# Patient Record
Sex: Female | Born: 1985 | Race: White | Hispanic: No | Marital: Single | Smoking: Current every day smoker
Health system: Southern US, Community
[De-identification: ages and names within clinical notes are randomized; demographics above are authoritative.]

## PROBLEM LIST (undated history)

## (undated) DIAGNOSIS — C801 Malignant (primary) neoplasm, unspecified: Secondary | ICD-10-CM

## (undated) DIAGNOSIS — F419 Anxiety disorder, unspecified: Secondary | ICD-10-CM

## (undated) HISTORY — PX: CERVICAL CONE BIOPSY: SUR198

---

## 2007-06-03 ENCOUNTER — Emergency Department (HOSPITAL_COMMUNITY): Admission: EM | Admit: 2007-06-03 | Discharge: 2007-06-03 | Payer: Self-pay | Admitting: Emergency Medicine

## 2009-08-18 IMAGING — CR DG CHEST 2V
2 series · 2 of 2 positions shown · non-contrast
Comparison: none

HISTORY: Cough, nausea, vomiting, diarrhea, fever

CHEST 2 VIEWS:
No prior study for comparison.
Normal heart size, mediastinal contours, and pulmonary vascularity.
Mild bronchitic changes.
No infiltrate, effusion, or pneumothorax.
Bones unremarkable.

[view not recorded (1 of 2)]
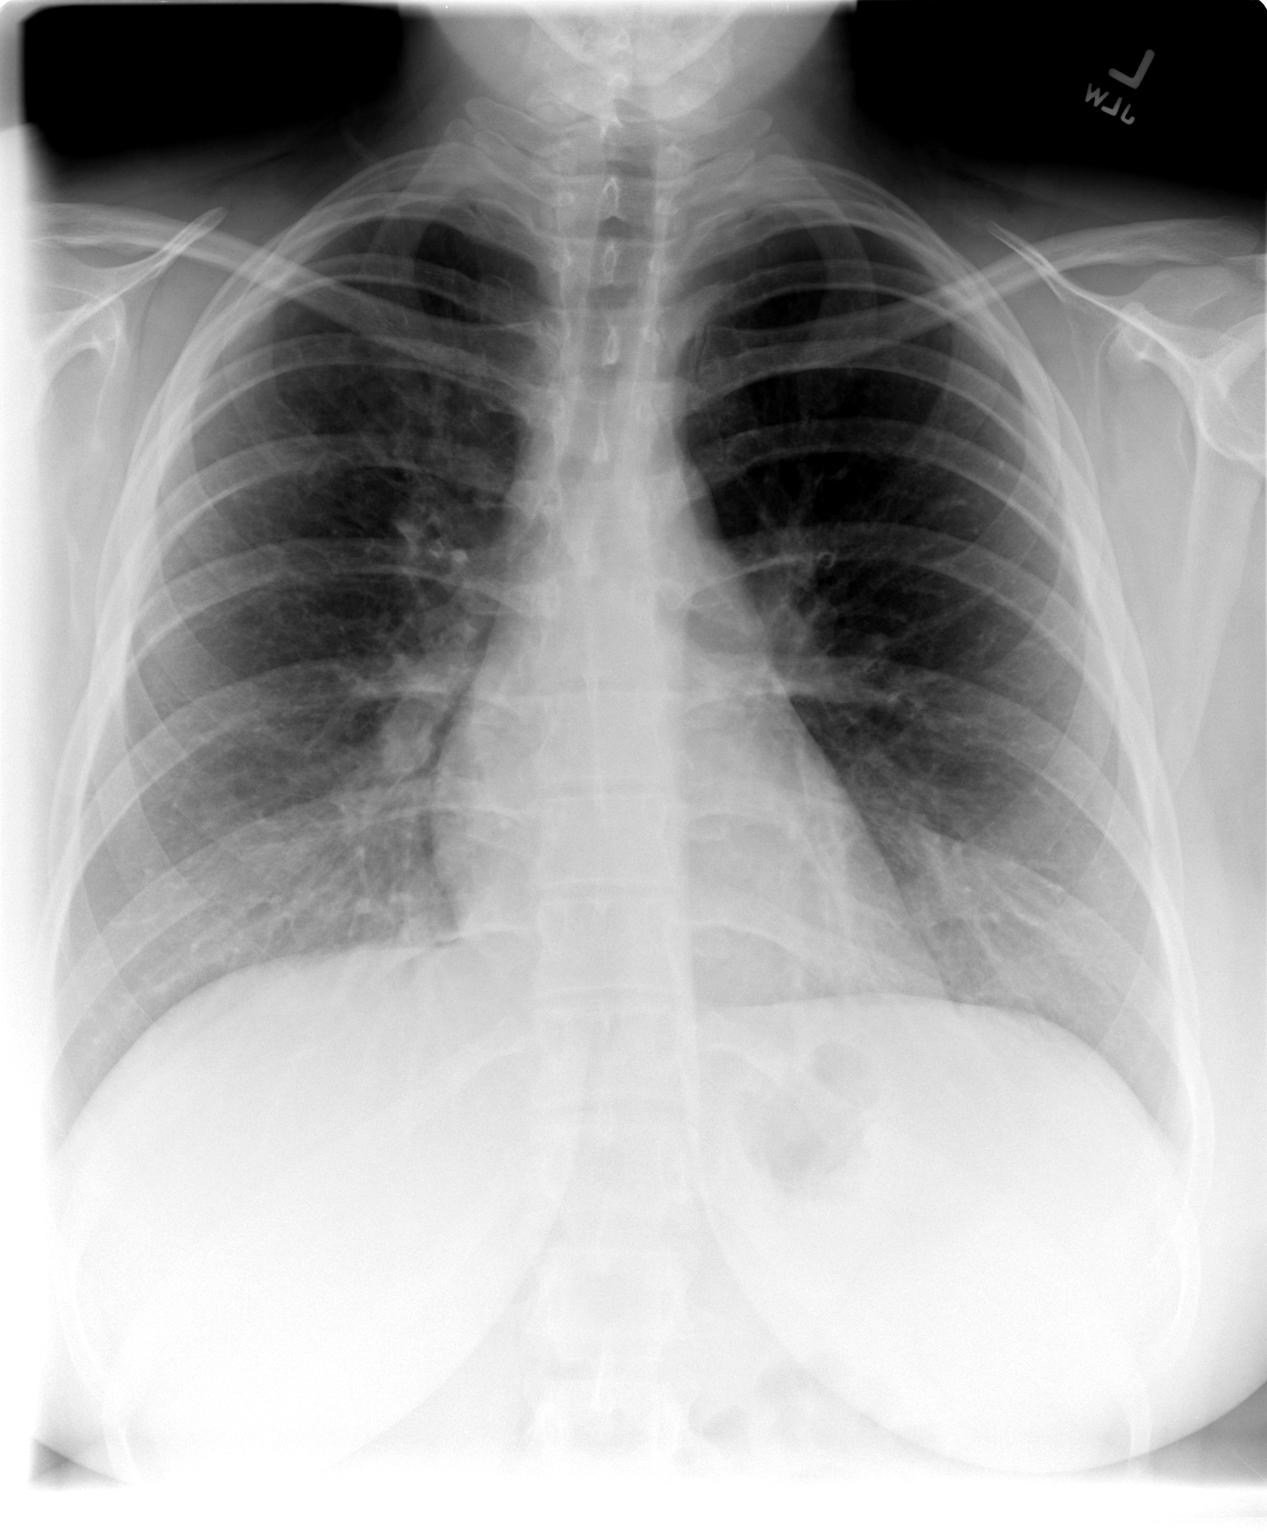

[view not recorded (2 of 2)]
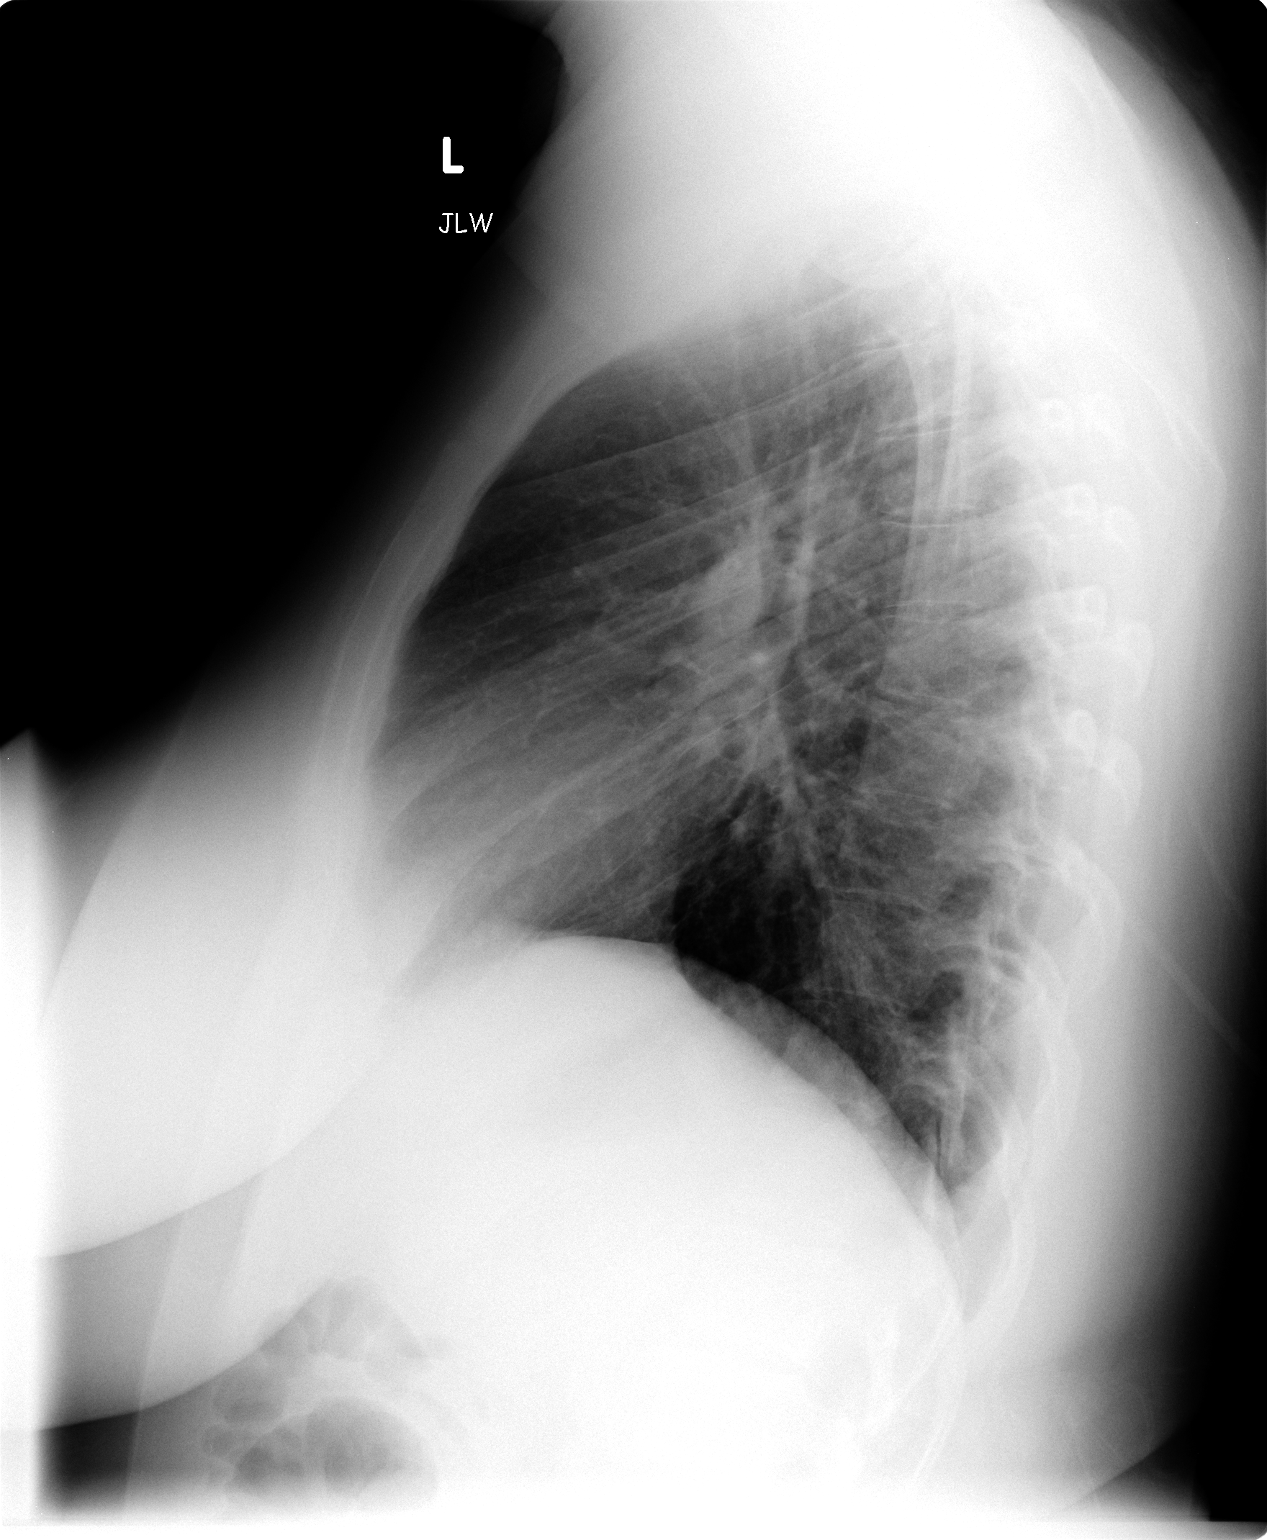

[2 of 2 positions shown; findings below may reference images not displayed]

IMPRESSION: Mild bronchitic changes.

## 2016-02-24 ENCOUNTER — Encounter (HOSPITAL_COMMUNITY): Payer: Self-pay | Admitting: Emergency Medicine

## 2016-02-24 ENCOUNTER — Emergency Department (HOSPITAL_COMMUNITY): Payer: Self-pay

## 2016-02-24 ENCOUNTER — Emergency Department (HOSPITAL_COMMUNITY)
Admission: EM | Admit: 2016-02-24 | Discharge: 2016-02-25 | Disposition: A | Discharge status: Home / Self Care | Payer: Self-pay | Attending: Emergency Medicine | Admitting: Emergency Medicine

## 2016-02-24 DIAGNOSIS — Z8541 Personal history of malignant neoplasm of cervix uteri: Secondary | ICD-10-CM | POA: Insufficient documentation

## 2016-02-24 DIAGNOSIS — R350 Frequency of micturition: Secondary | ICD-10-CM | POA: Insufficient documentation

## 2016-02-24 DIAGNOSIS — M545 Low back pain, unspecified: Secondary | ICD-10-CM

## 2016-02-24 DIAGNOSIS — F17200 Nicotine dependence, unspecified, uncomplicated: Secondary | ICD-10-CM | POA: Insufficient documentation

## 2016-02-24 DIAGNOSIS — Z79899 Other long term (current) drug therapy: Secondary | ICD-10-CM | POA: Insufficient documentation

## 2016-02-24 DIAGNOSIS — N898 Other specified noninflammatory disorders of vagina: Secondary | ICD-10-CM | POA: Insufficient documentation

## 2016-02-24 HISTORY — DX: Malignant (primary) neoplasm, unspecified: C80.1

## 2016-02-24 HISTORY — DX: Anxiety disorder, unspecified: F41.9

## 2016-02-24 LAB — COMPREHENSIVE METABOLIC PANEL
ALT: 18 U/L (ref 14–54)
ANION GAP: 10 (ref 5–15)
AST: 14 U/L — ABNORMAL LOW (ref 15–41)
Albumin: 4.5 g/dL (ref 3.5–5.0)
Alkaline Phosphatase: 71 U/L (ref 38–126)
BUN: 11 mg/dL (ref 6–20)
CHLORIDE: 106 mmol/L (ref 101–111)
CO2: 24 mmol/L (ref 22–32)
Calcium: 9.1 mg/dL (ref 8.9–10.3)
Creatinine, Ser: 0.74 mg/dL (ref 0.44–1.00)
GFR calc non Af Amer: >60 mL/min (ref >60)
Glucose, Bld: 85 mg/dL (ref 65–99)
Potassium: 3.8 mmol/L (ref 3.5–5.1)
SODIUM: 140 mmol/L (ref 135–145)
Total Bilirubin: 0.4 mg/dL (ref 0.3–1.2)
Total Protein: 8 g/dL (ref 6.5–8.1)

## 2016-02-24 LAB — URINALYSIS, ROUTINE W REFLEX MICROSCOPIC
Bilirubin Urine: NEGATIVE
Glucose, UA: NEGATIVE mg/dL
Hgb urine dipstick: NEGATIVE
Ketones, ur: NEGATIVE mg/dL
LEUKOCYTES UA: NEGATIVE
NITRITE: NEGATIVE
Protein, ur: NEGATIVE mg/dL
SPECIFIC GRAVITY, URINE: 1.01 (ref 1.005–1.030)
pH: 6 (ref 5.0–8.0)

## 2016-02-24 LAB — CBC WITH DIFFERENTIAL/PLATELET
Basophils Absolute: 0 10*3/uL (ref 0.0–0.1)
Basophils Relative: 0 %
EOS ABS: 0.1 10*3/uL (ref 0.0–0.7)
EOS PCT: 1 %
HCT: 41.8 % (ref 36.0–46.0)
Hemoglobin: 14.1 g/dL (ref 12.0–15.0)
LYMPHS ABS: 2.6 10*3/uL (ref 0.7–4.0)
Lymphocytes Relative: 33 %
MCH: 30 pg (ref 26.0–34.0)
MCHC: 33.7 g/dL (ref 30.0–36.0)
MCV: 88.9 fL (ref 78.0–100.0)
Monocytes Absolute: 0.3 10*3/uL (ref 0.1–1.0)
Monocytes Relative: 4 %
Neutro Abs: 4.9 10*3/uL (ref 1.7–7.7)
Neutrophils Relative %: 62 %
PLATELETS: 244 10*3/uL (ref 150–400)
RBC: 4.7 MIL/uL (ref 3.87–5.11)
RDW: 13.8 % (ref 11.5–15.5)
WBC: 7.9 10*3/uL (ref 4.0–10.5)

## 2016-02-24 LAB — LIPASE, BLOOD: Lipase: 19 U/L (ref 11–51)

## 2016-02-24 LAB — PREGNANCY, URINE: PREG TEST UR: NEGATIVE

## 2016-02-24 MED ORDER — IOPAMIDOL (ISOVUE-300) INJECTION 61%
100.0000 mL | Freq: Once | INTRAVENOUS | Status: AC | PRN
  Administered 2016-02-24: 100 mL via INTRAVENOUS

## 2016-02-24 MED ORDER — IOPAMIDOL (ISOVUE-300) INJECTION 61%
INTRAVENOUS | Status: AC
  Administered 2016-02-24: 30 mL
  Filled 2016-02-24: qty 30

## 2016-02-24 MED ORDER — KETOROLAC TROMETHAMINE 30 MG/ML IJ SOLN
30.0000 mg | Freq: Once | INTRAMUSCULAR | Status: AC
  Administered 2016-02-24: 30 mg via INTRAVENOUS
  Filled 2016-02-24: qty 1

## 2016-02-24 NOTE — ED Provider Notes (Signed)
[facility] DEPT Provider Note   CSN: [id number] Arrival date & time: 02/24/16  1959  By signing my name below, I, Hansel Feinstein, attest that this documentation has been prepared under the direction and in the presence of Noemi Chapel, MD. Electronically Signed: Hansel Feinstein, ED Scribe. 02/24/16. 8:59 PM.     History   Chief Complaint Chief Complaint  Patient presents with  . Back Pain  . Anxiety    HPI Michele Stevenson is a 30 y.o. female with h/o cervical cancer who presents to the Emergency Department complaining of moderate, constant, throbbing, bilateral lower back pain onset a week ago. Pt reports associated frequency of urination and vaginal discharge. Pt states she has taken Aleve with temporary relief of pain. She reports h/o similar pain with prior renal calculi. No recent antibiotic use. Pt states she was dx with cervical cancer 5 years ago, had 1 leep and had subsequently had conization 3 years ago. Pt states she did not go back for a check up after the second procedure because she was concerned she would have to have a hysterectomy. Pt also complains of worsened anxiety from baseline. Pt states she has not taken her medications for anxiety, depression and hyperthyroidism in over a week because she ran out and does not have insurance d/t a recent move from [facility] 2 weeks ago. Pt notes she is stressed as her daughter is currently in foster care d/t legal issues. Pt denies drug use. No h/o DM, HTN, HLD. She denies dysuria, fever, vomiting, diarrhea, bloody stools, leg pain, numbness.   The history is provided by the patient. No language interpreter was used.    Past Medical History:  Diagnosis Date  . Anxiety   . Cancer ([place])    cervical    There are no active problems to display for this patient.   Past Surgical History:  Procedure Laterality Date  . CERVICAL CONE BIOPSY    . CESAREAN SECTION      OB History    No data available       Home Medications    Prior  to Admission medications   Medication Sig Start Date End Date Taking? Authorizing Provider  Buprenorphine HCl-Naloxone HCl 8-2 MG FILM Place 1 Film under the tongue 2 (two) times daily.   Yes Historical Provider, MD  citalopram (CELEXA) 40 MG tablet Take 40 mg by mouth daily.   Yes Historical Provider, MD  clonazePAM (KLONOPIN) 1 MG tablet Take 1 mg by mouth 2 (two) times daily.   Yes Historical Provider, MD  levothyroxine (SYNTHROID, LEVOTHROID) 88 MCG tablet Take 88 mcg by mouth daily before breakfast.   Yes Historical Provider, MD  sulfamethoxazole-trimethoprim (BACTRIM DS,SEPTRA DS) 800-160 MG tablet Take 1 tablet by mouth 2 (two) times daily. 02/25/16 03/03/16  Noemi Chapel, MD    Family History History reviewed. No pertinent family history.  Social History Social History  Substance Use Topics  . Smoking status: Current Every Day Smoker    Packs/day: 0.50  . Smokeless tobacco: Never Used  . Alcohol use Yes     Comment: occ     Allergies   Ceclor [cefaclor]; Erythromycin; and Sulfur   Review of Systems Review of Systems  Constitutional: Negative for fever.  Gastrointestinal: Negative for blood in stool, diarrhea and vomiting.  Genitourinary: Positive for frequency and vaginal discharge. Negative for dysuria.  Musculoskeletal: Positive for back pain.  Neurological: Negative for numbness.  Psychiatric/Behavioral: The patient is nervous/anxious.   All other systems  reviewed and are negative.    Physical Exam Updated Vital Signs BP 128/78   Pulse 83   Temp 98.1 F (36.7 C) (Oral)   Resp 18   Ht 5\' 4"  (1.626 m)   Wt 190 lb (86.2 kg)   LMP 01/09/2016   SpO2 100%   BMI 32.61 kg/m   Physical Exam  Constitutional: She appears well-developed and well-nourished. No distress.  HENT:  Head: Normocephalic and atraumatic.  Mouth/Throat: Oropharynx is clear and moist. No oropharyngeal exudate.  Eyes: Conjunctivae and EOM are normal. Pupils are equal, round, and reactive  to light. Right eye exhibits no discharge. Left eye exhibits no discharge. No scleral icterus.  Neck: Normal range of motion. Neck supple. No JVD present. No thyromegaly present.  Cardiovascular: Normal rate, regular rhythm, normal heart sounds and intact distal pulses.  Exam reveals no gallop and no friction rub.   No murmur heard. Pulmonary/Chest: Effort normal and breath sounds normal. No respiratory distress. She has no wheezes. She has no rales.  Abdominal: Soft. Bowel sounds are normal. She exhibits no distension and no mass. There is no tenderness.  Genitourinary:  Genitourinary Comments: Chaperone present throughout entire exam.  The patient has normal-appearing external genitalia, there is a small amount of white discharge without a foul odor, no foreign body, no bleeding, no adnexal tenderness or masses, no cervical motion tenderness  Musculoskeletal: Normal range of motion. She exhibits tenderness. She exhibits no edema.  Bilateral lower back tenderness without midline tenderness.   Lymphadenopathy:    She has no cervical adenopathy.  Neurological: She is alert. Coordination normal.  Skin: Skin is warm and dry. No rash noted. No erythema.  Psychiatric: She has a normal mood and affect. Her behavior is normal.  Nursing note and vitals reviewed.    ED Treatments / Results   DIAGNOSTIC STUDIES: Oxygen Saturation is 100% on RA, normal by my interpretation.    COORDINATION OF CARE: 8:56 PM Discussed treatment plan with pt at bedside which includes XR and pt agreed to plan.    Labs (all labs ordered are listed, but only abnormal results are displayed) Labs Reviewed  WET PREP, GENITAL - Abnormal; Notable for the following:       Result Value   WBC, Wet Prep HPF POC FEW (*)    All other components within normal limits  COMPREHENSIVE METABOLIC PANEL - Abnormal; Notable for the following:    AST 14 (*)    All other components within normal limits  CBC WITH DIFFERENTIAL/PLATELET   LIPASE, BLOOD  URINALYSIS, ROUTINE W REFLEX MICROSCOPIC (NOT AT [facility])  PREGNANCY, URINE  GC/CHLAMYDIA PROBE AMP ([place]) NOT AT [facility]    EKG  EKG Interpretation None       Radiology Ct Abdomen Pelvis W Contrast  Result Date: 02/24/2016 CLINICAL DATA:  Acute onset of moderate bilateral lower back pain. Increased urinary frequency and vaginal discharge. Initial encounter. EXAM: CT ABDOMEN AND PELVIS WITH CONTRAST TECHNIQUE: Multidetector CT imaging of the abdomen and pelvis was performed using the standard protocol following bolus administration of intravenous contrast. CONTRAST:  192mL ISOVUE-300 IOPAMIDOL (ISOVUE-300) INJECTION 61% COMPARISON:  None. FINDINGS: Lower chest: The visualized lung bases are grossly clear. The visualized portions of the mediastinum are unremarkable. Hepatobiliary: The liver is unremarkable in appearance. The gallbladder is unremarkable in appearance. The common bile duct remains normal in caliber. Pancreas: The pancreas is within normal limits. Spleen: The spleen is unremarkable in appearance. Adrenals/Urinary Tract: The adrenal glands are unremarkable in  appearance. The kidneys are within normal limits. There is no evidence of hydronephrosis. No renal or ureteral stones are identified. No perinephric stranding is seen. Stomach/Bowel: The stomach is unremarkable in appearance. The small bowel is within normal limits. The appendix is normal in caliber, without evidence of appendicitis. The colon is unremarkable in appearance. Vascular/Lymphatic: The abdominal aorta is unremarkable in appearance. The inferior vena cava is grossly unremarkable. No retroperitoneal lymphadenopathy is seen. No pelvic sidewall lymphadenopathy is identified. Reproductive: Diffuse bladder wall thickening may reflect cystitis. The uterus is unremarkable. The ovaries are relatively symmetric. No suspicious adnexal masses are seen. Other: No additional soft tissue abnormalities are seen.  Musculoskeletal: No acute osseous abnormalities are identified. The visualized musculature is unremarkable in appearance. IMPRESSION: Diffuse bladder wall thickening may reflect cystitis. Electronically Signed   By: Garald Balding M.D.   On: 02/24/2016 23:15    Procedures Procedures (including critical care time)  Medications Ordered in ED Medications  sulfamethoxazole-trimethoprim (BACTRIM DS,SEPTRA DS) 800-160 MG per tablet 1 tablet (not administered)  ketorolac (TORADOL) 30 MG/ML injection 30 mg (30 mg Intravenous Given 02/24/16 2205)  iopamidol (ISOVUE-300) 61 % injection (30 mLs  Contrast Given 02/24/16 2237)  iopamidol (ISOVUE-300) 61 % injection 100 mL (100 mLs Intravenous Contrast Given 02/24/16 2236)     Initial Impression / Assessment and Plan / ED Course  I have reviewed the triage vital signs and the nursing notes.  Pertinent labs & imaging results that were available during my care of the patient were reviewed by me and considered in my medical decision making (see chart for details).  Clinical Course     The patient has no signs of acute abdominal findings, she does have some discharge in the vagina, this was negative for any findings for STD, she has no urinary whining, urinalysis however she does have a CT scan showing findings of possible cystitis. She will need to be further evaluated by gynecology in the outpatient setting as she does have a history of cervical cancer and has been untreated and did not go for in follow-up after her last surgery 3 years ago she was referred to local OB/GYN that she has recently moved to the area. The patient is stable for discharge, ibuprofen, Bactrim  Final Clinical Impressions(s) / ED Diagnoses   Final diagnoses:  Acute bilateral low back pain without sciatica    New Prescriptions New Prescriptions   SULFAMETHOXAZOLE-TRIMETHOPRIM (BACTRIM DS,SEPTRA DS) 800-160 MG TABLET    Take 1 tablet by mouth 2 (two) times daily.    I  personally performed the services described in this documentation, which was scribed in my presence. The recorded information has been reviewed and is accurate.      Noemi Chapel, MD 02/25/16 [phone]

## 2016-02-24 NOTE — ED Triage Notes (Signed)
Pt reports she has been having back pain x1 week, states she has cervical CA and "thinks that might be it". Has increased stress and has been off of anxiety medication X1 week, increase in panic attacks. Pt is calm and cooperative at triage. NAD noted.

## 2016-02-25 LAB — WET PREP, GENITAL
CLUE CELLS WET PREP: NONE SEEN
SPERM: NONE SEEN
TRICH WET PREP: NONE SEEN
Yeast Wet Prep HPF POC: NONE SEEN

## 2016-02-25 MED ORDER — IBUPROFEN 600 MG PO TABS: 600.0000 mg | ORAL_TABLET | Freq: Four times a day (QID) | ORAL | 0 refills | Status: AC | PRN

## 2016-02-25 MED ORDER — SULFAMETHOXAZOLE-TRIMETHOPRIM 800-160 MG PO TABS: 1.0000 | ORAL_TABLET | Freq: Two times a day (BID) | ORAL | 0 refills | Status: AC

## 2016-02-25 MED ORDER — SULFAMETHOXAZOLE-TRIMETHOPRIM 800-160 MG PO TABS
1.0000 | ORAL_TABLET | Freq: Once | ORAL | Status: AC
  Administered 2016-02-25: 1 via ORAL
  Filled 2016-02-25: qty 1

## 2016-02-25 NOTE — Discharge Instructions (Signed)
Bactrim twice daily for 7 days  [facility] Doctor List    Sinda Du MD. Specialty: Pulmonary Disease Contact information: [address]  [place] [address]  [phone]   Tula Nakayama, MD. Specialty: [facility] Medicine Contact information: [address] [place] [address]  [phone]   Sallee Lange, MD. Specialty: [facility] Medicine Contact information: [address]  [place] [address]  [phone]   Rosita Fire, MD Specialty: Internal Medicine Contact information: [place] [address]  [phone]   Delphina Cahill, MD. Specialty: Internal Medicine Contact information: [place] [address]  [phone]   Marjean Donna, MD. Specialty: Family Medicine Contact information: [place] [address]  [phone]   Leslie Andrea, MD. Specialty: Family Medicine Contact information: [place] [address]  [phone]   Asencion Noble, MD. Specialty: Internal Medicine Contact information: [address]  [place] [address]  [phone]   Please obtain all of your results from medical records or have your doctors office obtain the results - share them with your doctor - you should be seen at your doctors office in the next 2 days. Call today to arrange your follow up. Take the medications as prescribed. Please review all of the medicines and only take them if you do not have an allergy to them. Please be aware that if you are taking birth control pills, taking other prescriptions, ESPECIALLY ANTIBIOTICS may make the birth control ineffective - if this is the case, either do not engage in sexual activity or use alternative methods of birth control such as condoms until you have finished the medicine and your family doctor says it is OK to restart them. If you are on a blood thinner such as COUMADIN, be aware  that any other medicine that you take may cause the coumadin to either work too much, or not enough - you should have your coumadin level rechecked in next 7 days if this is the case.  ?  It is also a possibility that you have an allergic reaction to any of the medicines that you have been prescribed - Everybody reacts differently to medications and while MOST people have no trouble with most medicines, you may have a reaction such as nausea, vomiting, rash, swelling, shortness of breath. If this is the case, please stop taking the medicine immediately and contact your physician.  ?  You should return to the ER if you develop severe or worsening symptoms.

## 2016-02-28 LAB — GC/CHLAMYDIA PROBE AMP (~~LOC~~) NOT AT ARMC
CHLAMYDIA, DNA PROBE: NEGATIVE
Neisseria Gonorrhea: NEGATIVE
